# Patient Record
Sex: Female | Born: 2000 | Race: Black or African American | Hispanic: No | Marital: Single | Smoking: Never smoker
Health system: Southern US, Community
[De-identification: ages and names within clinical notes are randomized; demographics above are authoritative.]

---
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
## 2024-02-09 ENCOUNTER — Ambulatory Visit
Admission: EM | Admit: 2024-02-09 | Discharge: 2024-02-09 | Disposition: A | Discharge status: Home / Self Care | Payer: Self-pay | Attending: Family Medicine | Admitting: Family Medicine

## 2024-02-09 DIAGNOSIS — N760 Acute vaginitis: Secondary | ICD-10-CM | POA: Insufficient documentation

## 2024-02-09 NOTE — Discharge Instructions (Signed)
We will give you a call once your vaginal swab returns and let you know if anything comes back positive.  We will treat based on these results

## 2024-02-09 NOTE — ED Triage Notes (Signed)
Pt reports she is having a smelly discharge x 2 days   Last unprotected encounter x 2 weeks

## 2024-02-09 NOTE — ED Provider Notes (Signed)
[facility]    CSN: [id number] Arrival date & time: 02/09/24  1748      History   Chief Complaint No chief complaint on file.   HPI Jamie Black is a 23 y.o. female.   Presenting today with 2-day history of vaginal discharge, vaginal odor and irritation.  Denies rashes or lesions, pelvic or abdominal pain, nausea, vomiting, known exposures to STIs.  So far trying boric acid with only mild relief.    History reviewed. No pertinent past medical history.  There are no active problems to display for this patient.   History reviewed. No pertinent surgical history.  OB History   No obstetric history on file.      Home Medications    Prior to Admission medications   Medication Sig Start Date End Date Taking? Authorizing Provider  ibuprofen (ADVIL,MOTRIN) 600 MG tablet Take 1 tablet (600 mg total) by mouth every 6 (six) hours as needed. 04/05/16   Burgess Amor, PA-C    Family History History reviewed. No pertinent family history.  Social History Social History   Tobacco Use   Smoking status: Never  Substance Use Topics   Alcohol use: No   Drug use: No     Allergies   Patient has no active allergies.   Review of Systems Review of Systems PER HPI  Physical Exam Triage Vital Signs ED Triage Vitals  Encounter Vitals Group     BP 02/09/24 1758 (!) 142/94     Systolic BP Percentile --      Diastolic BP Percentile --      Pulse Rate 02/09/24 1758 99     Resp 02/09/24 1758 20     Temp 02/09/24 1758 98.2 F (36.8 C)     Temp Source 02/09/24 1758 Oral     SpO2 02/09/24 1758 99 %     Weight --      Height --      Head Circumference --      Peak Flow --      Pain Score 02/09/24 1800 0     Pain Loc --      Pain Education --      Exclude from Growth Chart --    No data found.  Updated Vital Signs BP (!) 142/94 (BP Location: Right Arm)   Pulse 99   Temp 98.2 F (36.8 C) (Oral)   Resp 20   LMP 01/25/2024 Comment: last day  SpO2 99%    Visual Acuity Right Eye Distance:   Left Eye Distance:   Bilateral Distance:    Right Eye Near:   Left Eye Near:    Bilateral Near:     Physical Exam Vitals and nursing note reviewed.  Constitutional:      Appearance: Normal appearance. She is not ill-appearing.  HENT:     Head: Atraumatic.  Eyes:     Extraocular Movements: Extraocular movements intact.     Conjunctiva/sclera: Conjunctivae normal.  Cardiovascular:     Rate and Rhythm: Normal rate and regular rhythm.     Heart sounds: Normal heart sounds.  Pulmonary:     Effort: Pulmonary effort is normal.     Breath sounds: Normal breath sounds.  Genitourinary:    Comments: GU exam deferred, self swab performed Musculoskeletal:        General: Normal range of motion.     Cervical back: Normal range of motion and neck supple.  Skin:    General: Skin is warm and dry.  Neurological:     Mental Status: She is alert and oriented to person, place, and time.  Psychiatric:        Mood and Affect: Mood normal.        Thought Content: Thought content normal.        Judgment: Judgment normal.     UC Treatments / Results  Labs (all labs ordered are listed, but only abnormal results are displayed) Labs Reviewed  CERVICOVAGINAL ANCILLARY ONLY    EKG   Radiology No results found.  Procedures Procedures (including critical care time)  Medications Ordered in UC Medications - No data to display  Initial Impression / Assessment and Plan / UC Course  I have reviewed the triage vital signs and the nursing notes.  Pertinent labs & imaging results that were available during my care of the patient were reviewed by me and considered in my medical decision making (see chart for details).     Vaginal swab pending, treat with boric acid, probiotics while waiting results and adjust if needed.  Return for worsening symptoms.  Final Clinical Impressions(s) / UC Diagnoses   Final diagnoses:  Acute vaginitis      Discharge Instructions      We will give you a call once your vaginal swab returns and let you know if anything comes back positive.  We will treat based on these results    ED Prescriptions   None    PDMP not reviewed this encounter.   Particia Nearing, [place] 02/09/24 [phone]

## 2024-02-12 ENCOUNTER — Telehealth (HOSPITAL_COMMUNITY): Payer: Self-pay

## 2024-02-12 LAB — CERVICOVAGINAL ANCILLARY ONLY
Bacterial Vaginitis (gardnerella): POSITIVE — AB
Candida Glabrata: NEGATIVE
Candida Vaginitis: NEGATIVE
Chlamydia: POSITIVE — AB
Comment: NEGATIVE
Comment: NEGATIVE
Comment: NEGATIVE
Comment: NEGATIVE
Comment: NEGATIVE
Comment: NORMAL
Neisseria Gonorrhea: NEGATIVE
Trichomonas: NEGATIVE

## 2024-02-12 MED ORDER — METRONIDAZOLE 500 MG PO TABS: 500.0000 mg | ORAL_TABLET | Freq: Two times a day (BID) | ORAL | 0 refills | Status: AC

## 2024-02-12 MED ORDER — AZITHROMYCIN 250 MG PO TABS: 1000.0000 mg | ORAL_TABLET | Freq: Once | ORAL | 0 refills | Status: AC

## 2024-02-12 NOTE — Telephone Encounter (Signed)
 Per protocol, pt requires tx with metronidazole. Pt requesting Azithromycin instead of Doxycycline for Chlamydia treatment.  Results seen on mychart

## 2024-05-30 ENCOUNTER — Other Ambulatory Visit: Payer: Self-pay

## 2024-05-30 ENCOUNTER — Emergency Department (HOSPITAL_COMMUNITY)
Admission: EM | Admit: 2024-05-30 | Discharge: 2024-05-30 | Disposition: A | Discharge status: Home / Self Care | Attending: Emergency Medicine | Admitting: Emergency Medicine

## 2024-05-30 DIAGNOSIS — I10 Essential (primary) hypertension: Secondary | ICD-10-CM | POA: Insufficient documentation

## 2024-05-30 DIAGNOSIS — Z79899 Other long term (current) drug therapy: Secondary | ICD-10-CM | POA: Diagnosis not present

## 2024-05-30 DIAGNOSIS — L02411 Cutaneous abscess of right axilla: Secondary | ICD-10-CM | POA: Diagnosis present

## 2024-05-30 LAB — POC URINE PREG, ED: Preg Test, Ur: NEGATIVE

## 2024-05-30 MED ORDER — DOXYCYCLINE HYCLATE 100 MG PO CAPS: 100.0000 mg | ORAL_CAPSULE | Freq: Two times a day (BID) | ORAL | 0 refills | Status: AC

## 2024-05-30 MED ORDER — LIDOCAINE-EPINEPHRINE (PF) 2 %-1:200000 IJ SOLN
10.0000 mL | Freq: Once | INTRAMUSCULAR | Status: AC
  Administered 2024-05-30: 10 mL
  Filled 2024-05-30: qty 20

## 2024-05-30 NOTE — ED Provider Notes (Signed)
 [facility] Provider Note   CSN: [id number] Arrival date & time: 05/30/24  2109     Patient presents with: Abscess  Jamie Black is a 23 y.o. female presents emergency department with a chief complaint of right armpit abscess.  Patient states that for the past 2 days she has had a abscess in her right armpit area that has been increasing in size and pain.  Patient states that she previously had some ingrown hairs to the area as well.  Patient denies ever having an abscess of her skin before.  Patient denies taking any medications at home, and states that her only past medical history is that she was diagnosed with postpartum hypertension.    Abscess Associated symptoms: no fever, no headaches, no nausea and no vomiting       Prior to Admission medications   Medication Sig Start Date End Date Taking? Authorizing Provider  doxycycline (VIBRAMYCIN) 100 MG capsule Take 1 capsule (100 mg total) by mouth 2 (two) times daily for 7 days. 05/30/24 06/06/24 Yes Saryah Loper F, PA-C  Ibuprofen -Acetaminophen (ACETAMINOPHEN-IBUPROFEN ) 125-250 MG TABS Take 4 tablets by mouth daily as needed (pain).   Yes [provider]    Allergies: Patient has no known allergies.    Review of Systems  Constitutional:  Negative for chills and fever.  Eyes:  Negative for visual disturbance.  Respiratory:  Negative for chest tightness and shortness of breath.   Cardiovascular:  Negative for chest pain and palpitations.  Gastrointestinal:  Negative for diarrhea, nausea and vomiting.  Skin:        Abscess of R armpit   Neurological:  Negative for dizziness, weakness and headaches.    Updated Vital Signs BP 133/83 (BP Location: Left Arm)   Pulse 89   Temp 98 F (36.7 C)   Resp 16   Ht 5' 4 (1.626 m)   Wt 59 kg   LMP 05/08/2024 (Approximate)   SpO2 98%   BMI 22.31 kg/m   Physical Exam Vitals and nursing note reviewed.  Constitutional:       General: She is not in acute distress.    Appearance: Normal appearance. She is not ill-appearing, toxic-appearing or diaphoretic.  HENT:     Head: Normocephalic and atraumatic.   Eyes:     Extraocular Movements: Extraocular movements intact.    Cardiovascular:     Rate and Rhythm: Normal rate and regular rhythm.  Pulmonary:     Effort: Pulmonary effort is normal. No respiratory distress.     Breath sounds: Normal breath sounds. No wheezing, rhonchi or rales.   Musculoskeletal:        General: Normal range of motion.   Skin:    General: Skin is warm and dry.     Capillary Refill: Capillary refill takes less than 2 seconds.     Comments: Single abscess present in R axillary region, tender to palpation, fluctuant, non-draining   Neurological:     General: No focal deficit present.     Mental Status: She is alert and oriented to person, place, and time.     Cranial Nerves: No cranial nerve deficit.     Sensory: No sensory deficit.     Motor: No weakness.     Coordination: Coordination normal.     Gait: Gait normal.   Psychiatric:        Mood and Affect: Mood normal.        Behavior: Behavior normal. Behavior is  cooperative.    (all labs ordered are listed, but only abnormal results are displayed) Labs Reviewed  POC URINE PREG, ED    EKG: None  Radiology: No results found.   .Incision and Drainage  Date/Time: 05/30/2024 11:00 PM  Performed by: Fonda Hymen, PA-C Authorized by: Fonda Hymen, PA-C   Consent:    Consent obtained:  Verbal   Consent given by:  Patient   Risks, benefits, and alternatives were discussed: yes     Risks discussed:  Bleeding, incomplete drainage and pain   Alternatives discussed:  No treatment and observation Universal protocol:    Patient identity confirmed:  Verbally with patient and arm band Location:    Type:  Abscess   Location: R axilla. Pre-procedure details:    Skin preparation:  Povidone-iodine Anesthesia:     Anesthesia method:  Local infiltration   Local anesthetic:  Lidocaine 2% WITH epi Procedure type:    Complexity:  Simple Procedure details:    Ultrasound guidance: no     Drainage:  Purulent   Packing materials:  None Post-procedure details:    Procedure completion:  Tolerated well, no immediate complications    Medications Ordered in the ED  lidocaine-EPINEPHrine (XYLOCAINE W/EPI) 2 %-1:200000 (PF) injection 10 mL (10 mLs Infiltration Given by Other 05/30/24 2158)    Clinical Course as of 05/30/24 2305  Thu May 30, 2024  2254 Preg Test, Ur: NEGATIVE [CH]    Clinical Course User Index [CH] Wende Longstreth, Ruta Cousins, PA-C                                 Medical Decision Making Amount and/or Complexity of Data Reviewed Labs: ordered. Decision-making details documented in ED Course.  Risk Prescription drug management.   Patient presents to the ED for concern of right axillary abscess, this involves an extensive number of treatment options, and is a complaint that carries with it a high risk of complications and morbidity.  The differential diagnosis includes abscess, cyst, inflamed lymph node, ingrown hair, etc.   Co morbidities that complicate the patient evaluation  none   Medicines ordered and prescription drug management:  I ordered medication including lidocaine for incision and drainage Reevaluation of the patient after these medicines showed that the patient improved I have reviewed the patients home medicines and have made adjustments as needed   Test Considered:  none   Critical Interventions:  none   Problem List / ED Course:  23 year old female otherwise healthy presents emergency department with chief complaint of right axillary abscess On physical exam area is consistent with abscess, very tender to palpation, raised, fluctuant, nondraining Incision and drainage performed, patient tolerated well, purulent drainage along with blood produced   Point-of-care urine pregnancy test negative Patient placed on outpatient antibiotics for 7 days Vitals rechecked, all within normal limits, patient denies systemic symptoms including fever/chills, concern for sepsis low at this time Return precautions given Patient discharged   Reevaluation:  After the interventions noted above, I reevaluated the patient and found that they have :improved   Social Determinants of Health:  none   Dispostion:  After consideration of the diagnostic results and the patients response to treatment, I feel that the patient would benefit from discharge and outpatient therapy with antibiotics as prescribed.  Return precautions given.  Patient discharged..       Final diagnoses:  Abscess of right axilla    ED Discharge  Orders          Ordered    doxycycline (VIBRAMYCIN) 100 MG capsule  2 times daily        05/30/24 2254               Coriann Brouhard F, PA-C 05/30/24 2306    Ninetta Basket, MD 05/31/24 [phone]

## 2024-10-07 ENCOUNTER — Ambulatory Visit
Admission: EM | Admit: 2024-10-07 | Discharge: 2024-10-07 | Disposition: A | Discharge status: Home / Self Care | Source: Ambulatory Visit | Attending: Student | Admitting: Student

## 2024-10-07 DIAGNOSIS — H00014 Hordeolum externum left upper eyelid: Secondary | ICD-10-CM | POA: Diagnosis not present

## 2024-10-07 DIAGNOSIS — H00024 Hordeolum internum left upper eyelid: Secondary | ICD-10-CM

## 2024-10-07 MED ORDER — NEOMYCIN-POLYMYXIN-DEXAMETH 0.1 % OP OINT: 1.0000 | TOPICAL_OINTMENT | Freq: Three times a day (TID) | OPHTHALMIC | 0 refills | Status: AC

## 2024-10-07 MED ORDER — CEPHALEXIN 500 MG PO CAPS: 500.0000 mg | ORAL_CAPSULE | Freq: Two times a day (BID) | ORAL | 0 refills | Status: AC

## 2024-10-07 NOTE — ED Provider Notes (Addendum)
 [facility]    CSN: [id number] Arrival date & time: 10/07/24  1453      History   Chief Complaint Chief Complaint  Patient presents with   Eye Pain    HPI Jamie Black is a 23 y.o. female presenting with left eyelid issue.  Medical history noncontributory, she wears glasses not contacts.  She describes left upper eyelid inflammation, swelling, discomfort for 4 days (since 10/04/2024), since having her lash extensions done.  Since then, she has used over-the-counter eyedrops, and warm compresses with minimal relief.  She denies visual changes, sensation of foreign body, vision loss, new floaters, right eye issue.  HPI  History reviewed. No pertinent past medical history.  There are no active problems to display for this patient.   History reviewed. No pertinent surgical history.  OB History   No obstetric history on file.      Home Medications    Prior to Admission medications   Medication Sig Start Date End Date Taking? Authorizing Provider  cephALEXin (KEFLEX) 500 MG capsule Take 1 capsule (500 mg total) by mouth 2 (two) times daily for 7 days. 10/07/24 10/14/24 Yes Saket Hellstrom E, PA-C  neomycin -polymyxin-dexameth (MAXITROL) 0.1 % OINT Place 1 Application into the left eye 3 (three) times daily for 7 days. 10/07/24 10/14/24 Yes Shian Goodnow E, PA-C  ibuprofen  (ADVIL ,MOTRIN ) 600 MG tablet Take 1 tablet (600 mg total) by mouth every 6 (six) hours as needed. 04/05/16   Idol, Julie, PA-C  Ibuprofen -Acetaminophen (ACETAMINOPHEN-IBUPROFEN ) 125-250 MG TABS Take 4 tablets by mouth daily as needed (pain).    [provider]    Family History History reviewed. No pertinent family history.  Social History Social History   Tobacco Use   Smoking status: Never  Substance Use Topics   Alcohol use: No   Drug use: No     Allergies   Patient has no known allergies.   Review of Systems Review of Systems  Constitutional:  Negative for chills  and fever.  HENT:  Negative for ear pain and sore throat.   Eyes:  Negative for photophobia, pain, discharge, redness, itching and visual disturbance.       Stye  Respiratory:  Negative for cough and shortness of breath.   Cardiovascular:  Negative for chest pain and palpitations.  Gastrointestinal:  Negative for abdominal pain and vomiting.  Genitourinary:  Negative for dysuria and hematuria.  Musculoskeletal:  Negative for arthralgias and back pain.  Skin:  Negative for color change and rash.  Neurological:  Negative for seizures and syncope.  All other systems reviewed and are negative.    Physical Exam Triage Vital Signs ED Triage Vitals [10/07/24 1508]  Encounter Vitals Group     BP 126/84     Girls Systolic BP Percentile      Girls Diastolic BP Percentile      Boys Systolic BP Percentile      Boys Diastolic BP Percentile      Pulse Rate 94     Resp 16     Temp 98.7 F (37.1 C)     Temp Source Oral     SpO2 98 %     Weight      Height      Head Circumference      Peak Flow      Pain Score 5     Pain Loc      Pain Education      Exclude from Growth Chart  No data found.  Updated Vital Signs BP 126/84 (BP Location: Right Arm)   Pulse 94   Temp 98.7 F (37.1 C) (Oral)   Resp 16   LMP 09/30/2024 (Exact Date)   SpO2 98%   Visual Acuity Right Eye Distance: 20/25 Left Eye Distance: 20/25 Bilateral Distance: 20/25  Right Eye Near:   Left Eye Near:    Bilateral Near:     Physical Exam Vitals reviewed.  Constitutional:      Appearance: Normal appearance.  HENT:     Head: Normocephalic and atraumatic.     Right Ear: Tympanic membrane, ear canal and external ear normal. There is no impacted cerumen.     Left Ear: Tympanic membrane, ear canal and external ear normal. There is no impacted cerumen.     Nose: Nose normal. No congestion.     Mouth/Throat:     Pharynx: Oropharynx is clear. No posterior oropharyngeal erythema.  Eyes:     General: Lids are  everted, no foreign bodies appreciated. Vision grossly intact. Gaze aligned appropriately. No visual field deficit.       Right eye: No foreign body, discharge or hordeolum.        Left eye: Hordeolum present.No foreign body or discharge.     Extraocular Movements: Extraocular movements intact.     Right eye: Normal extraocular motion and no nystagmus.     Left eye: Normal extraocular motion and no nystagmus.     Conjunctiva/sclera: Conjunctivae normal.     Right eye: Right conjunctiva is not injected. No chemosis, exudate or hemorrhage.    Left eye: Left conjunctiva is not injected. No chemosis, exudate or hemorrhage.    Pupils: Pupils are equal, round, and reactive to light.     Visual Fields: Right eye visual fields normal and left eye visual fields normal.     Comments: See image below Left lid with hordeolum internum to the outer aspect of the lid.  The lid is mildly swollen.  There is no proptosis.  There is no conjunctival injection, or foreign body. PERRLA, EOMI without pain. No orbital tenderness.   Cardiovascular:     Rate and Rhythm: Normal rate and regular rhythm.     Heart sounds: Normal heart sounds.  Pulmonary:     Effort: Pulmonary effort is normal.     Breath sounds: Normal breath sounds.  Neurological:     General: No focal deficit present.     Mental Status: She is alert.  Psychiatric:        Mood and Affect: Mood normal.        Behavior: Behavior normal.        Thought Content: Thought content normal.        Judgment: Judgment normal.       UC Treatments / Results  Labs (all labs ordered are listed, but only abnormal results are displayed) Labs Reviewed - No data to display  EKG   Radiology No results found.  Procedures Procedures (including critical care time)  Medications Ordered in UC Medications - No data to display  Initial Impression / Assessment and Plan / UC Course  I have reviewed the triage vital signs and the nursing notes.  Pertinent  labs & imaging results that were available during my care of the patient were reviewed by me and considered in my medical decision making (see chart for details).     Patient is a 23 year old female presenting with hordeolum internum of the left upper lid.  She  is afebrile, nontachycardic.  Visual acuity is intact.  She wears glasses, but not contacts.  There is no proptosis or pain with extraocular movements, so I am not concerned for preseptal or orbital cellulitis.  Will manage with neomycin  polymyxin-dexamethasone -ointment, and Keflex.  She will continue warm compresses, and over-the-counter drops for symptomatic relief.  Return precautions: Infection gets worse instead of better, including worsening of swelling, new visual changes, pain with extraocular movements.  LMP 09/30/2024, patient states that she is not pregnant.  Final Clinical Impressions(s) / UC Diagnoses   Final diagnoses:  Hordeolum internum of left upper eyelid     Discharge Instructions      -Keflex twice daily x7 days -Antibiotic ointment three time daily for 7 days -Get rid of your old makeup and and get new -Follow up if symptoms are worse instead of better: vision changes, increased swelling, etc     ED Prescriptions     Medication Sig Dispense Auth. Provider   cephALEXin (KEFLEX) 500 MG capsule Take 1 capsule (500 mg total) by mouth 2 (two) times daily for 7 days. 14 capsule Jaidee Stipe E, PA-C   neomycin -polymyxin-dexameth (MAXITROL) 0.1 % OINT Place 1 Application into the left eye 3 (three) times daily for 7 days. 21 g Hersey Maclellan E, PA-C      PDMP not reviewed this encounter.   Arlyss Leita BRAVO, PA-C 10/07/24 1533    Arlyss Leita BRAVO, PA-C 10/07/24 1536

## 2024-10-07 NOTE — Discharge Instructions (Addendum)
-  Keflex twice daily x7 days -Antibiotic ointment three time daily for 7 days -Get rid of your old makeup and and get new -Follow up if symptoms are worse instead of better: vision changes, increased swelling, etc

## 2024-10-07 NOTE — ED Triage Notes (Signed)
 Redness, swelling to left eye with yellow drainage x 4 days. Tried stye eye drops and warm compress with no relief of symptoms.
